# Patient Record
Sex: Male | Born: 1975 | Race: White | Hispanic: No | Marital: Married | Smoking: Never smoker
Health system: Southern US, Community
[De-identification: ages and names within clinical notes are randomized; demographics above are authoritative.]

## PROBLEM LIST (undated history)

## (undated) DIAGNOSIS — C73 Malignant neoplasm of thyroid gland: Secondary | ICD-10-CM

---

## 2012-04-26 ENCOUNTER — Other Ambulatory Visit (HOSPITAL_COMMUNITY): Payer: Self-pay | Admitting: Internal Medicine

## 2012-04-26 DIAGNOSIS — C73 Malignant neoplasm of thyroid gland: Secondary | ICD-10-CM

## 2012-04-30 ENCOUNTER — Encounter (HOSPITAL_COMMUNITY)
Admission: RE | Admit: 2012-04-30 | Discharge: 2012-04-30 | Disposition: A | Discharge status: Home / Self Care | Payer: BC Managed Care – PPO | Source: Ambulatory Visit | Attending: Internal Medicine | Admitting: Internal Medicine

## 2012-04-30 DIAGNOSIS — C73 Malignant neoplasm of thyroid gland: Secondary | ICD-10-CM | POA: Insufficient documentation

## 2012-04-30 MED ORDER — SODIUM IODIDE I 131 CAPSULE
144.0000 | Freq: Once | INTRAVENOUS | Status: AC | PRN
  Administered 2012-04-30: 144 via ORAL

## 2012-05-09 ENCOUNTER — Encounter (HOSPITAL_COMMUNITY)
Admission: RE | Admit: 2012-05-09 | Discharge: 2012-05-09 | Disposition: A | Discharge status: Home / Self Care | Payer: BC Managed Care – PPO | Source: Ambulatory Visit | Attending: Internal Medicine | Admitting: Internal Medicine

## 2012-05-09 ENCOUNTER — Other Ambulatory Visit (HOSPITAL_COMMUNITY): Payer: Self-pay | Admitting: Internal Medicine

## 2012-05-09 DIAGNOSIS — C73 Malignant neoplasm of thyroid gland: Secondary | ICD-10-CM | POA: Insufficient documentation

## 2013-11-01 ENCOUNTER — Other Ambulatory Visit (HOSPITAL_COMMUNITY): Payer: Self-pay | Admitting: Internal Medicine

## 2013-11-01 DIAGNOSIS — C73 Malignant neoplasm of thyroid gland: Secondary | ICD-10-CM

## 2013-11-11 ENCOUNTER — Other Ambulatory Visit (HOSPITAL_COMMUNITY): Payer: BC Managed Care – PPO

## 2013-11-25 ENCOUNTER — Encounter (HOSPITAL_COMMUNITY)
Admission: RE | Admit: 2013-11-25 | Discharge: 2013-11-25 | Disposition: A | Discharge status: Home / Self Care | Payer: BC Managed Care – PPO | Source: Ambulatory Visit | Attending: Internal Medicine | Admitting: Internal Medicine

## 2013-11-25 ENCOUNTER — Ambulatory Visit (HOSPITAL_COMMUNITY): Payer: BC Managed Care – PPO

## 2013-11-26 ENCOUNTER — Encounter (HOSPITAL_COMMUNITY): Payer: BC Managed Care – PPO

## 2013-11-27 ENCOUNTER — Encounter (HOSPITAL_COMMUNITY): Payer: BC Managed Care – PPO

## 2013-11-29 ENCOUNTER — Encounter (HOSPITAL_COMMUNITY): Payer: BC Managed Care – PPO

## 2013-12-10 ENCOUNTER — Telehealth: Payer: Self-pay | Admitting: Hematology & Oncology

## 2013-12-10 NOTE — Telephone Encounter (Signed)
Wife called wanting pt to see MD. Per Dr. Myna Hidalgo and wife aware to call us if it turns metastatic. We don't see pts for thyroid ca

## 2013-12-23 ENCOUNTER — Ambulatory Visit (HOSPITAL_COMMUNITY): Payer: BC Managed Care – PPO

## 2013-12-23 ENCOUNTER — Encounter (HOSPITAL_COMMUNITY): Payer: BC Managed Care – PPO

## 2013-12-24 ENCOUNTER — Encounter (HOSPITAL_COMMUNITY): Payer: Self-pay

## 2013-12-25 ENCOUNTER — Encounter (HOSPITAL_COMMUNITY): Payer: Self-pay

## 2013-12-27 ENCOUNTER — Encounter (HOSPITAL_COMMUNITY): Payer: Self-pay

## 2014-01-06 ENCOUNTER — Encounter (HOSPITAL_COMMUNITY)
Admission: RE | Admit: 2014-01-06 | Discharge: 2014-01-06 | Disposition: A | Discharge status: Home / Self Care | Payer: BC Managed Care – PPO | Source: Ambulatory Visit | Attending: Internal Medicine | Admitting: Internal Medicine

## 2014-01-06 ENCOUNTER — Ambulatory Visit (HOSPITAL_COMMUNITY)
Admission: RE | Admit: 2014-01-06 | Discharge: 2014-01-06 | Disposition: A | Discharge status: Home / Self Care | Payer: BC Managed Care – PPO | Source: Ambulatory Visit | Attending: Internal Medicine | Admitting: Internal Medicine

## 2014-01-06 DIAGNOSIS — C73 Malignant neoplasm of thyroid gland: Secondary | ICD-10-CM | POA: Insufficient documentation

## 2014-01-06 MED ORDER — THYROTROPIN ALFA 1.1 MG IM SOLR
0.9000 mg | INTRAMUSCULAR | Status: AC
  Administered 2014-01-06: 0.9 mg via INTRAMUSCULAR

## 2014-01-07 ENCOUNTER — Encounter (HOSPITAL_COMMUNITY)
Admission: RE | Admit: 2014-01-07 | Discharge: 2014-01-07 | Disposition: A | Discharge status: Home / Self Care | Payer: BC Managed Care – PPO | Source: Ambulatory Visit | Attending: Internal Medicine | Admitting: Internal Medicine

## 2014-01-07 MED ORDER — THYROTROPIN ALFA 1.1 MG IM SOLR
0.9000 mg | INTRAMUSCULAR | Status: AC
  Administered 2014-01-07: 0.9 mg via INTRAMUSCULAR

## 2014-01-08 ENCOUNTER — Encounter (HOSPITAL_COMMUNITY)
Admission: RE | Admit: 2014-01-08 | Discharge: 2014-01-08 | Disposition: A | Discharge status: Home / Self Care | Payer: BC Managed Care – PPO | Source: Ambulatory Visit | Attending: Internal Medicine | Admitting: Internal Medicine

## 2014-01-08 MED ORDER — SODIUM IODIDE I 131 CAPSULE
4.0000 | Freq: Once | INTRAVENOUS | Status: AC | PRN
  Administered 2014-01-08: 4 via ORAL

## 2014-01-10 ENCOUNTER — Encounter (HOSPITAL_COMMUNITY)
Admission: RE | Admit: 2014-01-10 | Discharge: 2014-01-10 | Disposition: A | Discharge status: Home / Self Care | Payer: BC Managed Care – PPO | Source: Ambulatory Visit | Attending: Internal Medicine | Admitting: Internal Medicine

## 2014-01-13 LAB — THYROGLOBULIN ANTIBODY

## 2014-01-13 LAB — THYROGLOBULIN LEVEL: Thyroglobulin: <0.2 ng/mL (ref 0.0–55.0)

## 2015-11-01 IMAGING — US US SOFT TISSUE HEAD/NECK
1 series · 14 of 25 positions shown · non-contrast
Comparison: 02/29/2012, 05/09/2012

CLINICAL DATA: Thyroid carcinoma

EXAM:
THYROID ULTRASOUND
TECHNIQUE: Ultrasound examination of the thyroid gland and adjacent soft
tissues was performed.

[Series 1: us soft tissue head/neck · 0.05mm/px · 14 of 49 slices shown]
[im 1/49]
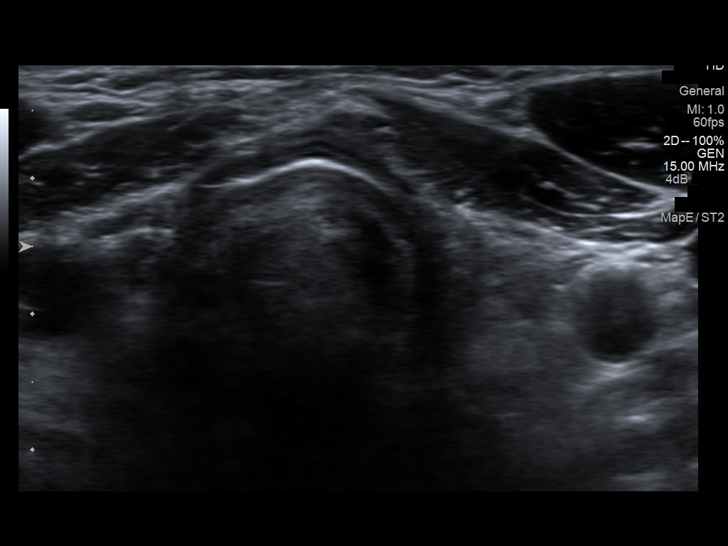
[im 5/49]
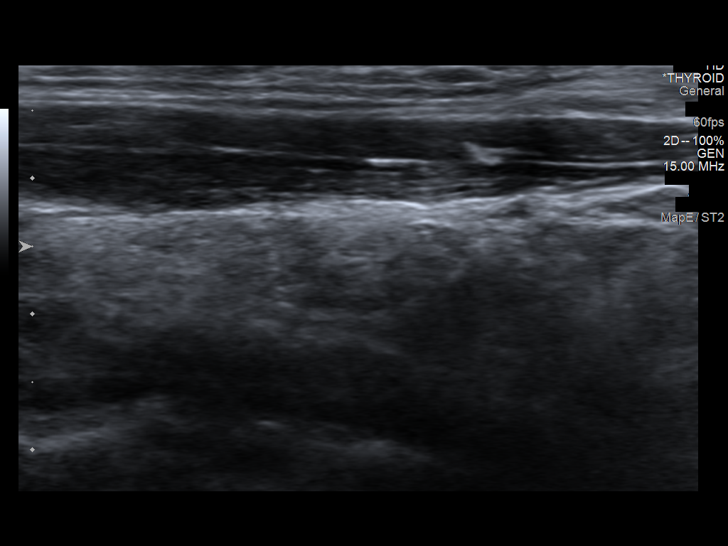
[im 9/49]
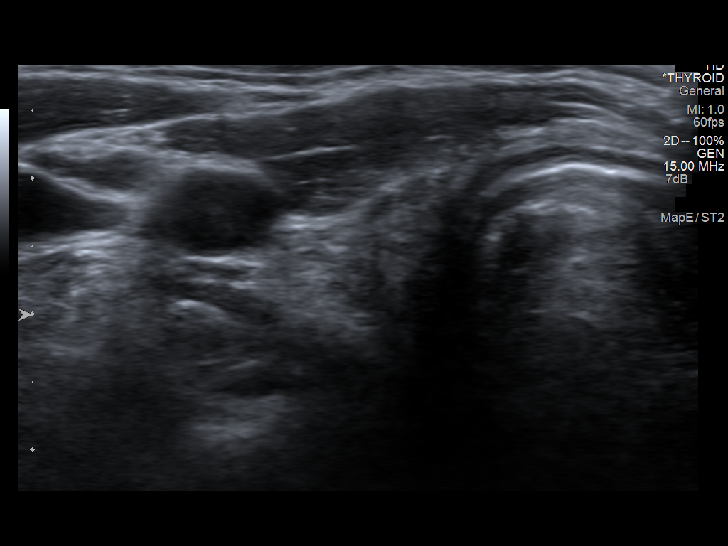
[im 13/49]
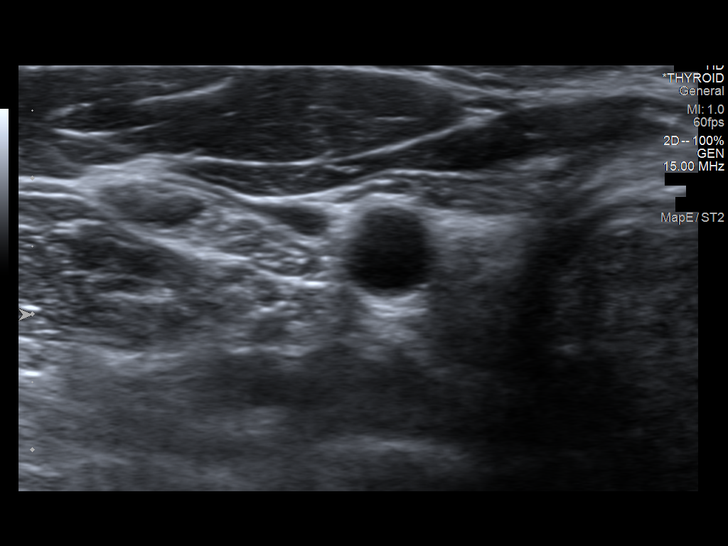
[im 17/49]
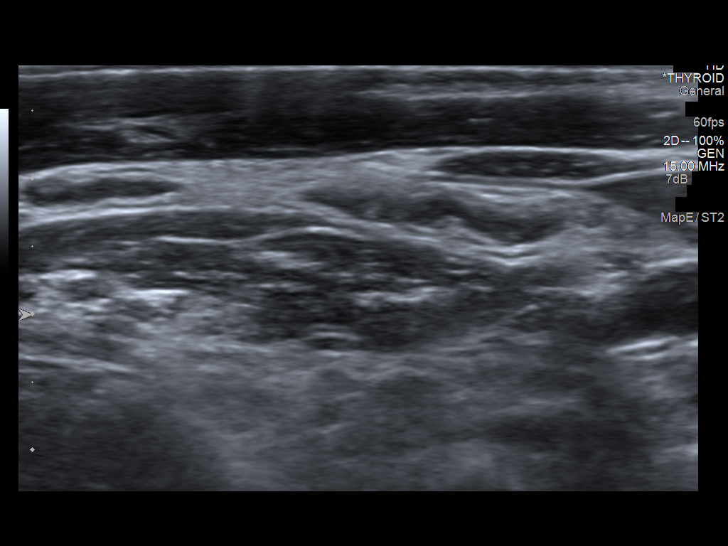
[im 19/49]
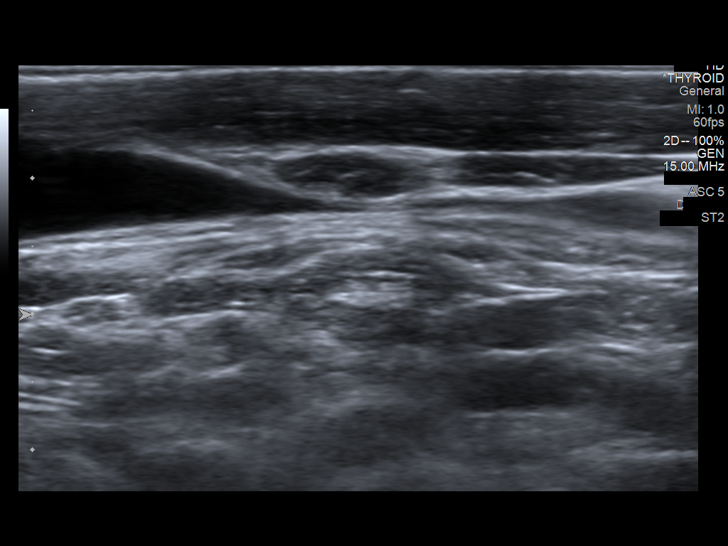
[im 23/49]
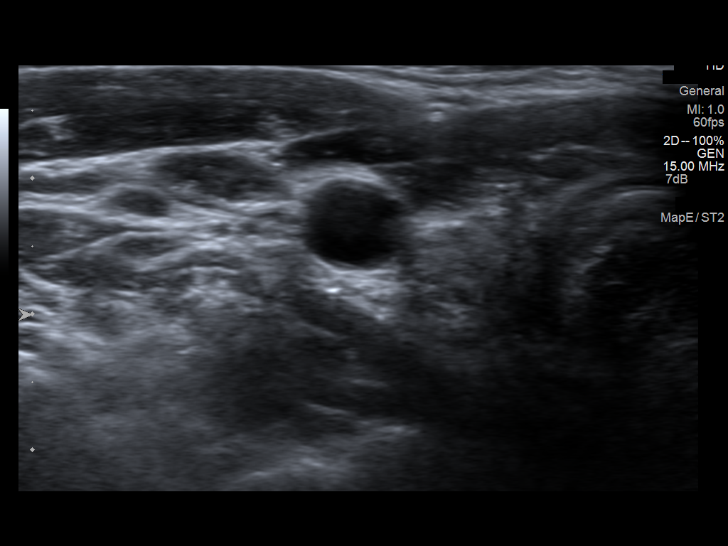
[im 27/49]
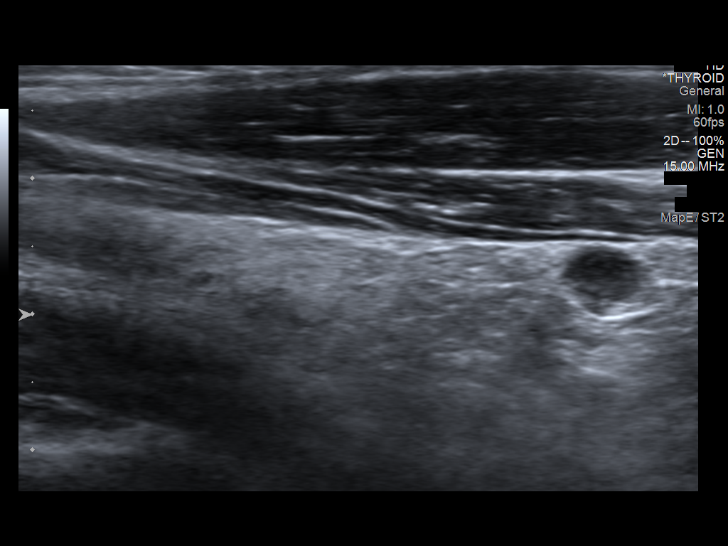
[im 31/49]
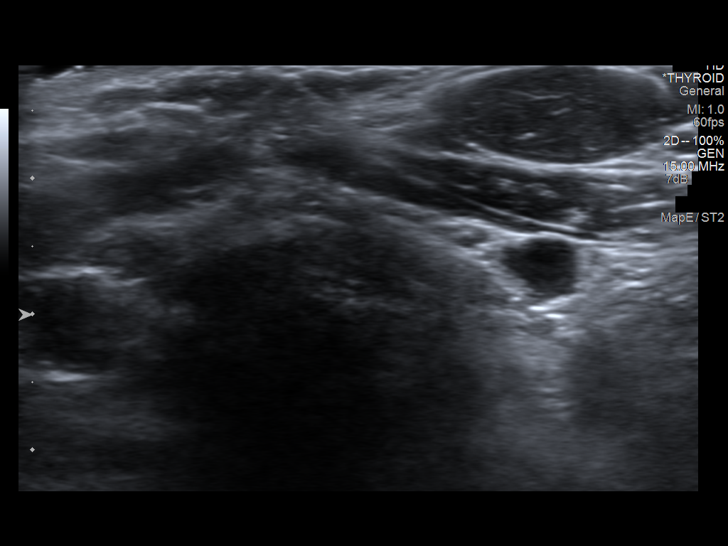
[im 33/49]
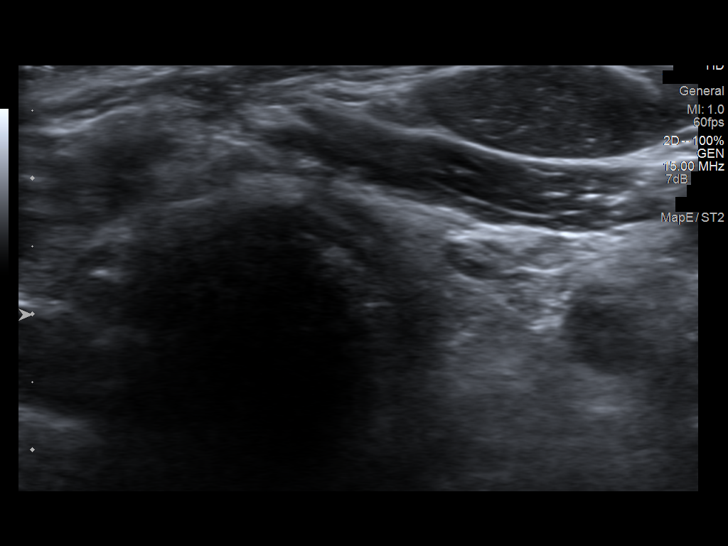
[im 37/49]
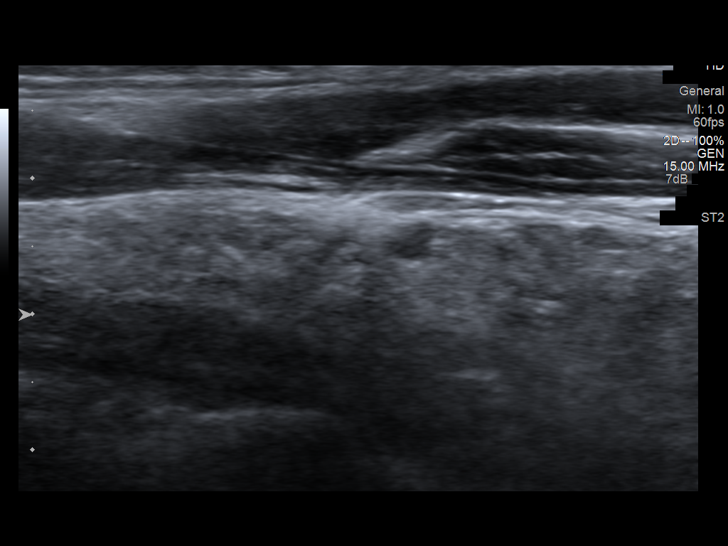
[im 41/49]
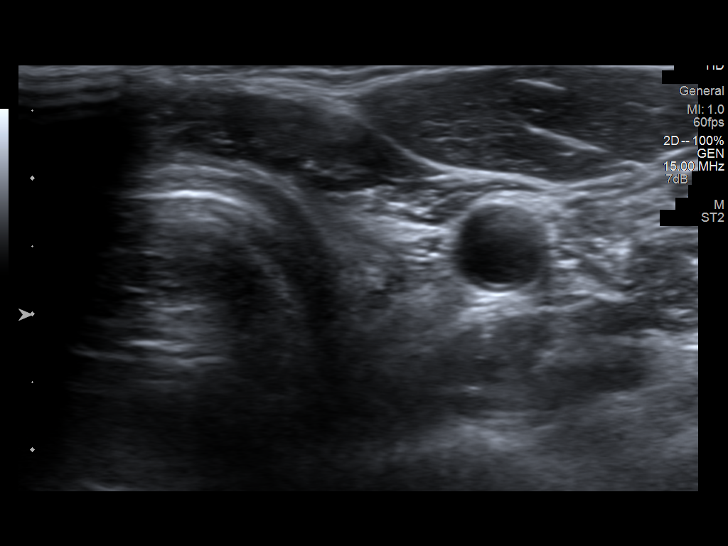
[im 45/49]
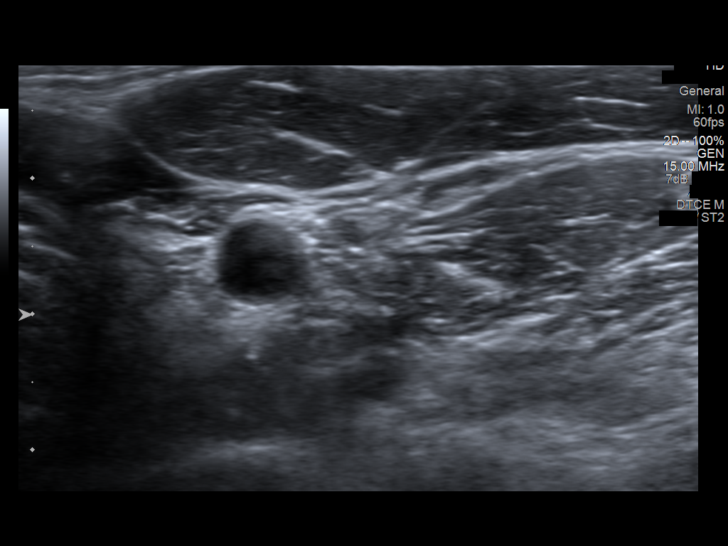
[im 49/49]
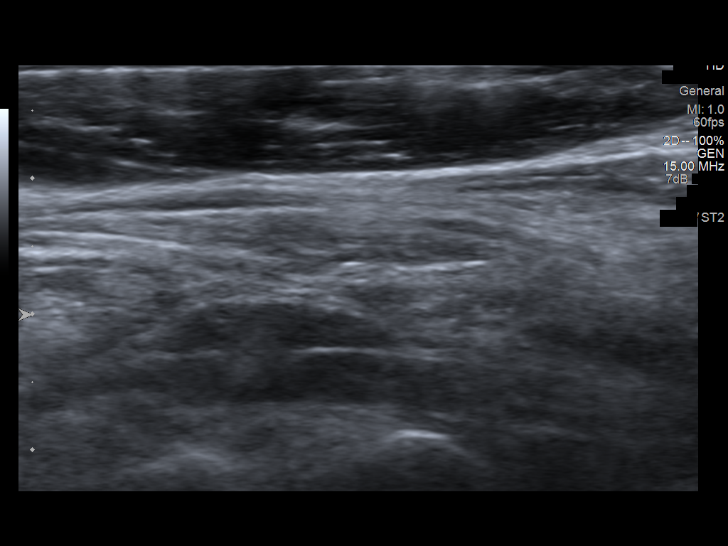

[14 of 25 positions shown; findings below may reference images not displayed]

FINDINGS: The patient is status post thyroidectomy. A few small (6 mm short
axis) soft tissue lesions are identified in the neck on the right,
lateral to the vasculature. Likely represent small lymph nodes. On
the left, 2 small areas of decreased echogenicity are noted
measuring 6 and 4 mm respectively. These may represent residual
thyroid tissue. No other definitive mass lesion is seen.
IMPRESSION: Areas of soft tissue in the left neck suggestive of residual thyroid
tissue. Iodine scan may be helpful for further evaluation.

Changes suggestive of small lymph nodes in the right neck although
these are small in nature.

## 2022-12-25 ENCOUNTER — Ambulatory Visit
Admission: RE | Admit: 2022-12-25 | Discharge: 2022-12-25 | Disposition: A | Discharge status: Home / Self Care | Payer: Medicaid Other | Source: Ambulatory Visit

## 2022-12-25 VITALS — BP 120/78 | HR 92 | Temp 98.2°F | Resp 16

## 2022-12-25 DIAGNOSIS — J0100 Acute maxillary sinusitis, unspecified: Secondary | ICD-10-CM

## 2022-12-25 HISTORY — DX: Malignant neoplasm of thyroid gland: C73

## 2022-12-25 MED ORDER — AMOXICILLIN-POT CLAVULANATE 875-125 MG PO TABS: 1.0000 | ORAL_TABLET | Freq: Two times a day (BID) | ORAL | 0 refills | Status: AC

## 2022-12-25 NOTE — ED Triage Notes (Signed)
Pt c/o cough, congestion and facial pressure.  Started: Monday  Home interventions: OTC medications

## 2022-12-25 NOTE — ED Provider Notes (Signed)
[facility]    CSN: [id number] Arrival date & time: 12/25/22  2353      History   Chief Complaint Chief Complaint  Patient presents with   Cough    Cough and congestion - Entered by patient   Nasal Congestion    HPI Nathan French is a 47 y.o. male.   Patient here today for evaluation of cough, congestion and facial pressure that started about a week ago.  He reports that sinus congestion and pressure in his face seem to be the worst symptoms.  He has not had fever.  He denies any vomiting or diarrhea.  He does note that mucus is very thick.  He has tried over-the-counter medications without resolution.  The history is provided by the patient.  Cough Associated symptoms: no chills, no ear pain, no eye discharge, no fever, no shortness of breath and no sore throat     Past Medical History:  Diagnosis Date   Thyroid cancer ([place])     There are no problems to display for this patient.   History reviewed. No pertinent surgical history.     Home Medications    Prior to Admission medications   Medication Sig Start Date End Date Taking? Authorizing Provider  amoxicillin-clavulanate (AUGMENTIN) 875-125 MG tablet Take 1 tablet by mouth every 12 (twelve) hours. 12/25/22  Yes Francene Finders, PA-C  SYNTHROID 150 MCG tablet Take by mouth.    [provider]    Family History History reviewed. No pertinent family history.  Social History Social History   Tobacco Use   Smoking status: Never   Smokeless tobacco: Never  Vaping Use   Vaping Use: Never used  Substance Use Topics   Alcohol use: Not Currently   Drug use: Never     Allergies   Bee venom   Review of Systems Review of Systems  Constitutional:  Negative for chills and fever.  HENT:  Positive for congestion and sinus pressure. Negative for ear pain and sore throat.   Eyes:  Negative for discharge and redness.  Respiratory:  Positive for cough. Negative for shortness of breath.    Gastrointestinal:  Negative for nausea and vomiting.     Physical Exam Triage Vital Signs ED Triage Vitals  Enc Vitals Group     BP 12/25/22 0917 120/78     Pulse Rate 12/25/22 0917 92     Resp 12/25/22 0917 16     Temp 12/25/22 0917 98.2 F (36.8 C)     Temp Source 12/25/22 0917 Oral     SpO2 12/25/22 0917 97 %     Weight --      Height --      Head Circumference --      Peak Flow --      Pain Score 12/25/22 0918 4     Pain Loc --      Pain Edu? --      Excl. in [place]? --    No data found.  Updated Vital Signs BP 120/78 (BP Location: Left Arm)   Pulse 92   Temp 98.2 F (36.8 C) (Oral)   Resp 16   SpO2 97%      Physical Exam Vitals and nursing note reviewed.  Constitutional:      General: He is not in acute distress.    Appearance: Normal appearance. He is not ill-appearing.  HENT:     Head: Normocephalic and atraumatic.     Nose: Congestion present.  Mouth/Throat:     Mouth: Mucous membranes are moist.     Pharynx: Oropharynx is clear. No oropharyngeal exudate or posterior oropharyngeal erythema.  Eyes:     Conjunctiva/sclera: Conjunctivae normal.  Cardiovascular:     Rate and Rhythm: Normal rate and regular rhythm.     Heart sounds: Normal heart sounds. No murmur heard. Pulmonary:     Effort: Pulmonary effort is normal. No respiratory distress.     Breath sounds: Normal breath sounds. No wheezing, rhonchi or rales.  Skin:    General: Skin is warm and dry.  Neurological:     Mental Status: He is alert.  Psychiatric:        Mood and Affect: Mood normal.        Thought Content: Thought content normal.      UC Treatments / Results  Labs (all labs ordered are listed, but only abnormal results are displayed) Labs Reviewed - No data to display  EKG   Radiology No results found.  Procedures Procedures (including critical care time)  Medications Ordered in UC Medications - No data to display  Initial Impression / Assessment and Plan / UC  Course  I have reviewed the triage vital signs and the nursing notes.  Pertinent labs & imaging results that were available during my care of the patient were reviewed by me and considered in my medical decision making (see chart for details).    Suspect likely sinusitis and will treat with Augmentin.  Recommended symptomatic treatment as needed otherwise.  Encouraged follow-up if no gradual improvement with any further concerns.  Final Clinical Impressions(s) / UC Diagnoses   Final diagnoses:  Acute maxillary sinusitis, recurrence not specified   Discharge Instructions   None    ED Prescriptions     Medication Sig Dispense Auth. Provider   amoxicillin-clavulanate (AUGMENTIN) 875-125 MG tablet Take 1 tablet by mouth every 12 (twelve) hours. 14 tablet Francene Finders, PA-C      PDMP not reviewed this encounter.   Francene Finders, PA-C 12/25/22 [phone]
